# Patient Record
Sex: Female | Born: 2005 | Race: White | Hispanic: No | Marital: Single | Smoking: Never smoker
Health system: Southern US, Community
[De-identification: ages and names within clinical notes are randomized; demographics above are authoritative.]

## PROBLEM LIST (undated history)

## (undated) DIAGNOSIS — J45909 Unspecified asthma, uncomplicated: Secondary | ICD-10-CM

---
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
## 2024-03-15 DIAGNOSIS — Z308 Encounter for other contraceptive management: Secondary | ICD-10-CM | POA: Diagnosis not present

## 2024-05-13 DIAGNOSIS — Z3202 Encounter for pregnancy test, result negative: Secondary | ICD-10-CM | POA: Diagnosis not present

## 2024-05-13 DIAGNOSIS — Z30430 Encounter for insertion of intrauterine contraceptive device: Secondary | ICD-10-CM | POA: Diagnosis not present

## 2024-06-29 DIAGNOSIS — Z30431 Encounter for routine checking of intrauterine contraceptive device: Secondary | ICD-10-CM | POA: Diagnosis not present
